# Patient Record
Sex: Female | Born: 1985
Health system: Southern US, Community
[De-identification: ages and names within clinical notes are randomized; demographics above are authoritative.]

---

## 2019-10-30 IMAGING — CT CT ABD-PELV W/ CM
2 of 4 series · 16 of 46 positions shown, 18 images · IV contrast (Omni 300)
Comparison: None.

CLINICAL DATA: Right-sided upper abdominal and flank pain since
[REDACTED].

EXAM:
CT ABDOMEN AND PELVIS WITH CONTRAST
TECHNIQUE: Multidetector CT imaging of the abdomen and pelvis was performed
using the standard protocol following bolus administration of
intravenous contrast.
CONTRAST:  100mL OMNIPAQUE IOHEXOL 300 MG/ML  SOLN

[Series 3: a/p w/ 5mm · axial · 0.98mm/px · z∈[-497,-77]mm · 13 of 92 slices shown, 15 images]
[im 4/92  soft-tissue]
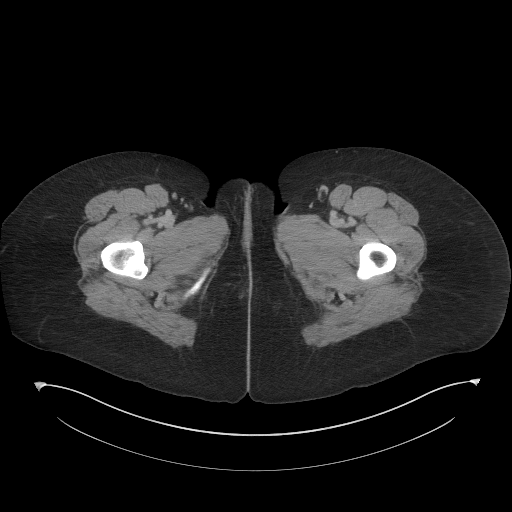
[im 4/92  bone]
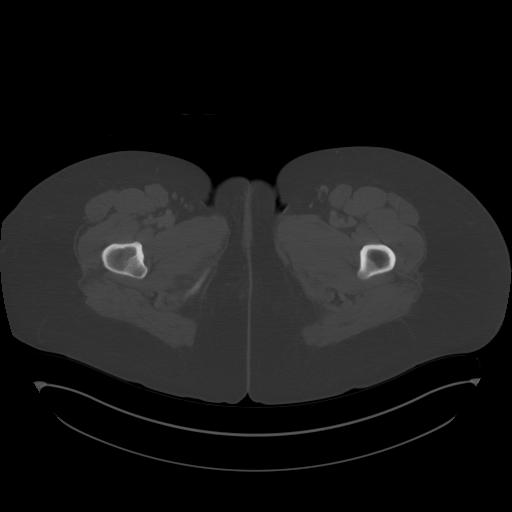
[im 11/92  soft-tissue]
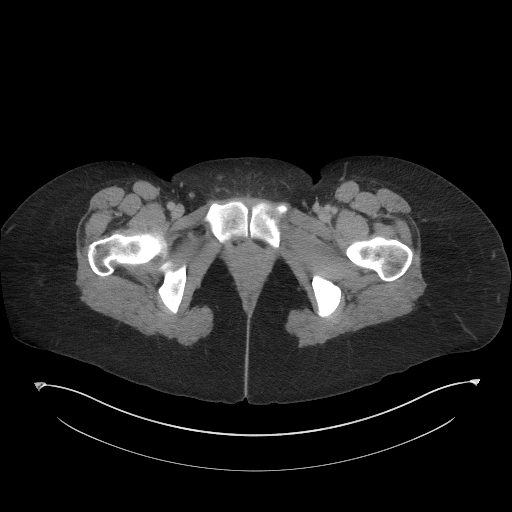
[im 19/92  soft-tissue]
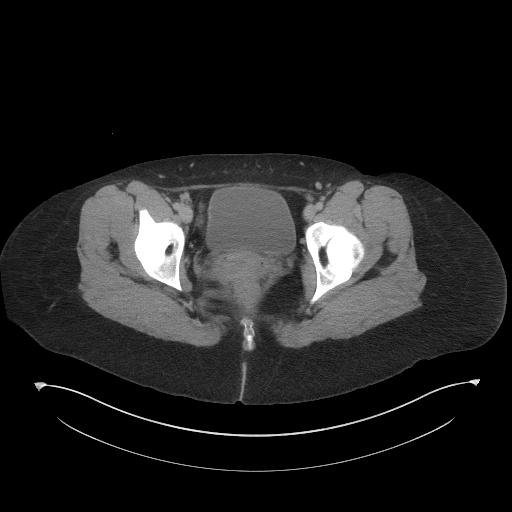
[im 26/92  soft-tissue]
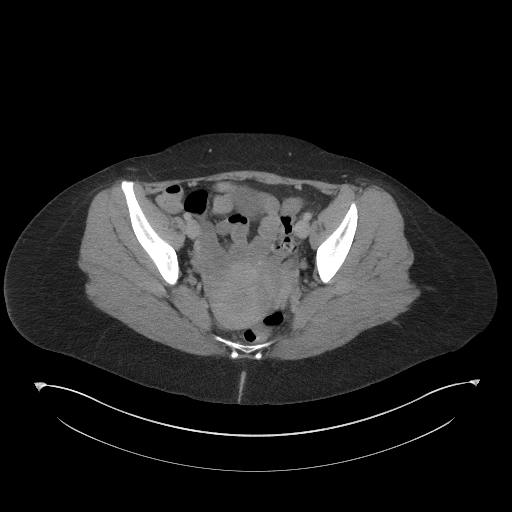
[im 33/92  soft-tissue]
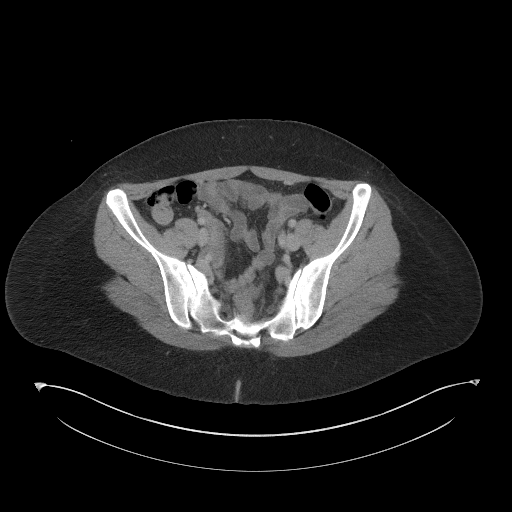
[im 41/92  soft-tissue]
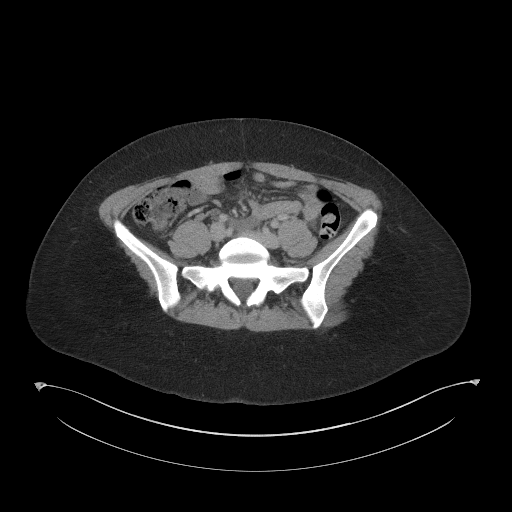
[im 48/92  soft-tissue]
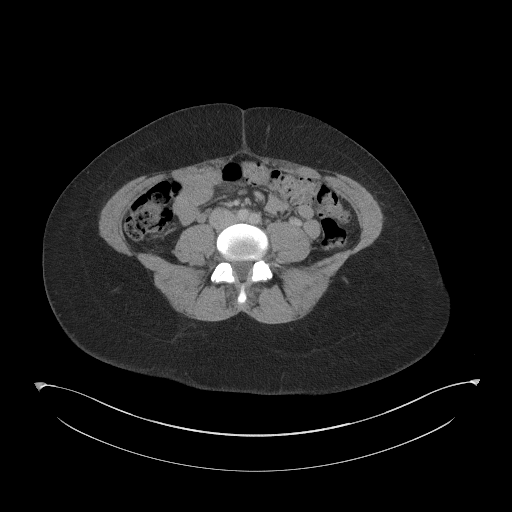
[im 51/92  soft-tissue]
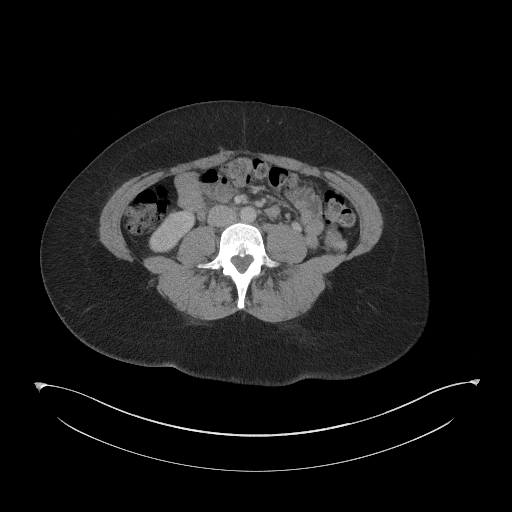
[im 59/92  soft-tissue]
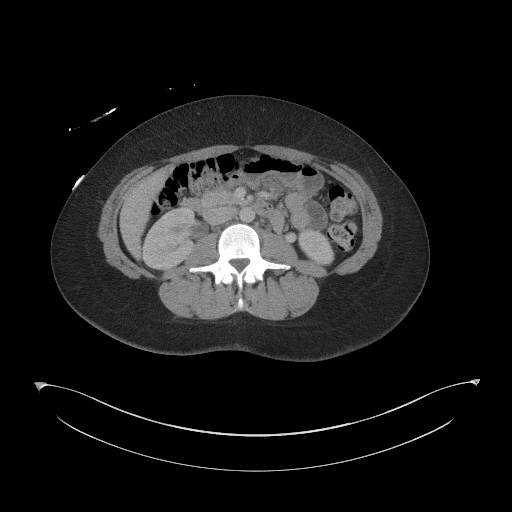
[im 59/92  bone]
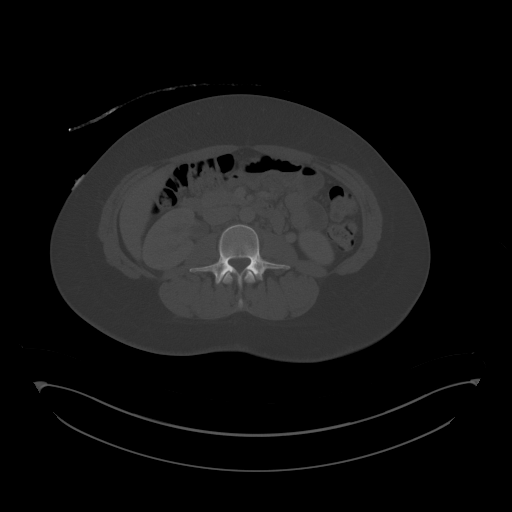
[im 66/92  soft-tissue]
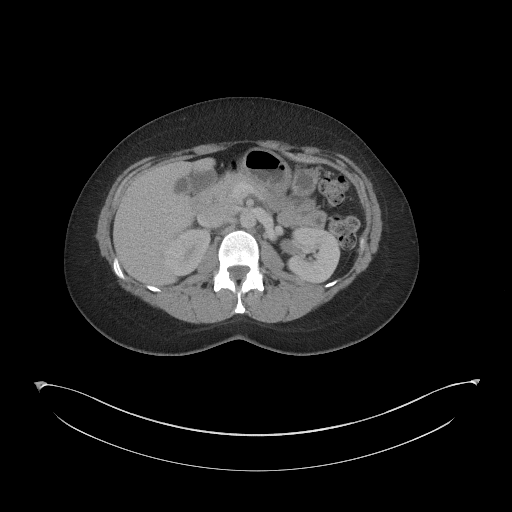
[im 73/92  soft-tissue]
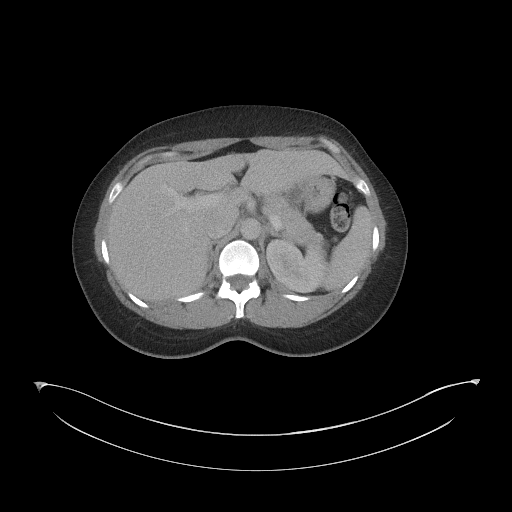
[im 81/92  soft-tissue]
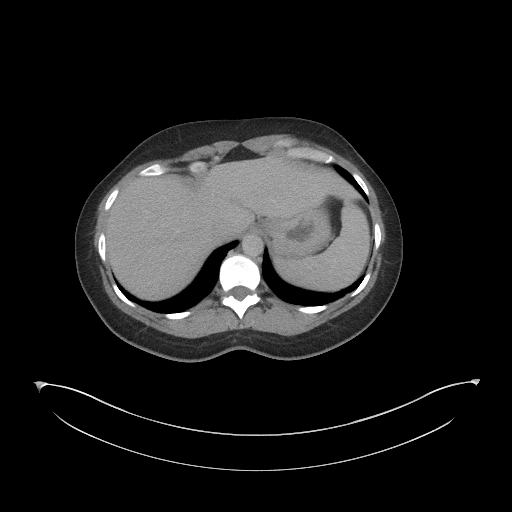
[im 88/92  soft-tissue]
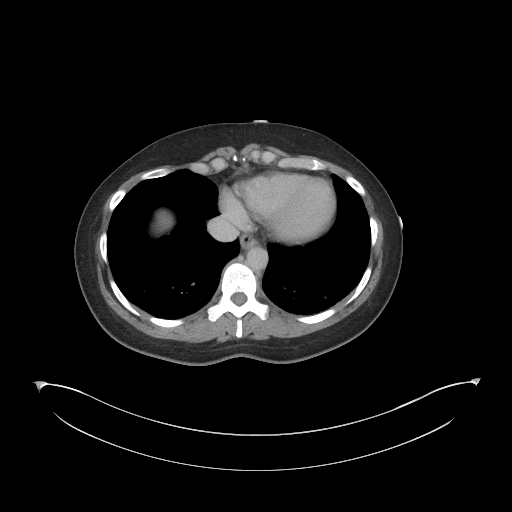

[Series 6: a/p w/ cor · coronal · 0.85mm/px · 3 of 152 slices shown]
[im 51/152  soft-tissue]
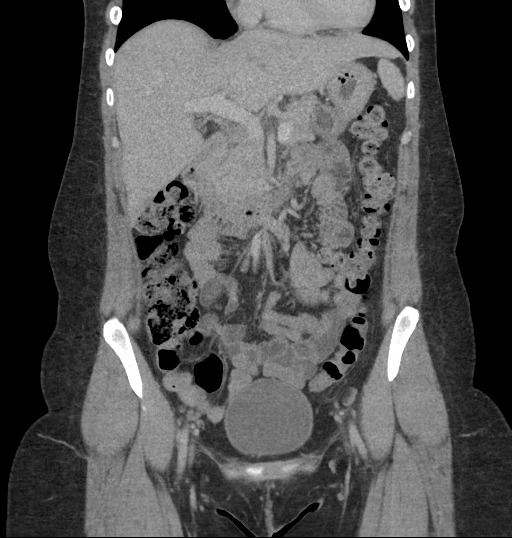
[im 68/152  soft-tissue]
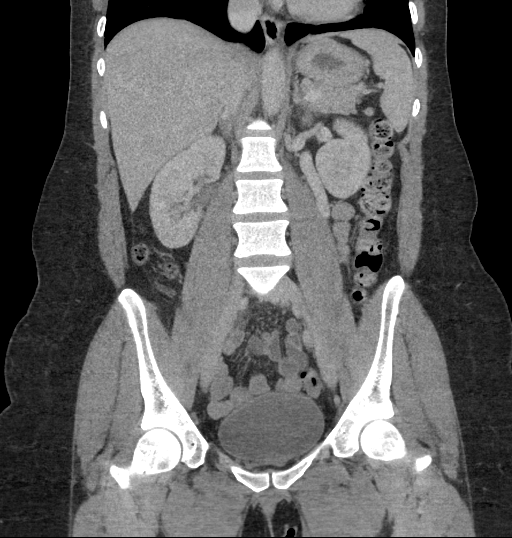
[im 84/152  soft-tissue]
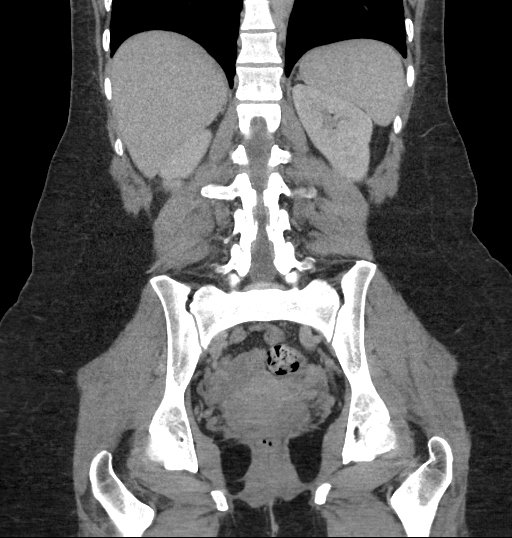

[16 of 46 positions shown; findings below may reference images not displayed]

FINDINGS: Lower chest: The lung bases are clear of acute process. No pleural
effusion or pulmonary lesions. The heart is normal in size. No
pericardial effusion. The distal esophagus and aorta are
unremarkable.

Hepatobiliary: No focal hepatic lesions or intrahepatic biliary
dilatation. The gallbladder is normal. No common bile duct
dilatation.

Pancreas: No mass, inflammation or ductal dilatation.

Spleen: Normal size.  No focal lesions.

Adrenals/Urinary Tract: The adrenal glands and kidneys are
unremarkable.

Mild right-sided hydroureteronephrosis but no obstructing ureteral
calculi are identified. No renal calculi. No perinephric
interstitial changes. Could not exclude a recently passed calculus.

The left kidney is normal. No renal or ureteral calculi. No renal
lesions.

The bladder is unremarkable. No asymmetric bladder wall thickening
or bladder mass.

Stomach/Bowel: The stomach, duodenum, small bowel and colon are
grossly normal without oral contrast. No inflammatory changes, mass
lesions or obstructive findings. The terminal ileum and appendix are
normal.

Vascular/Lymphatic: The aorta is normal in caliber. No dissection.
The branch vessels are patent. The major venous structures are
patent. No mesenteric or retroperitoneal mass or adenopathy. Small
scattered lymph nodes are noted.

Reproductive: The uterus is retroverted. The endometrium appears
normal in thickness. The ovaries are unremarkable.

Other: No pelvic mass or significant free pelvic fluid collections.
No inguinal mass or adenopathy.

Musculoskeletal: No significant bony findings.
IMPRESSION: 1. Mild right-sided hydroureteronephrosis but no obstructing
ureteral calculus or bladder calculus. Possibly recent passed stone.
2. No renal or bladder calculi or mass.
3. No acute abdominal/pelvic findings, mass lesions or adenopathy.

## 2021-01-20 IMAGING — MR MR ANKLE*R* W/O CM
4 of 6 series · 19 of 40 positions shown · non-contrast
Comparison: Multiple exams, including [DATE] radiograph

CLINICAL DATA: Pain in foot and ankle.

EXAM:
MRI OF THE RIGHT ANKLE WITHOUT CONTRAST
TECHNIQUE: Multiplanar, multisequence MR imaging of the ankle was performed. No
intravenous contrast was administered.

[Series 3: PD fat-sat · axial · right · 3.0mm · 0.31mm/px · z∈[-18,+116]mm · 7 of 35 slices shown]
[im 1/35]
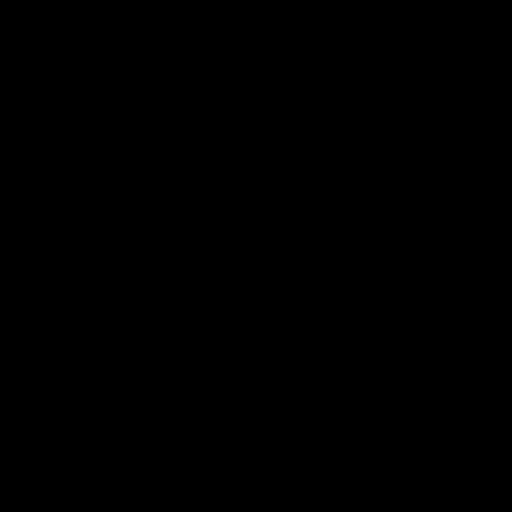
[im 6/35]
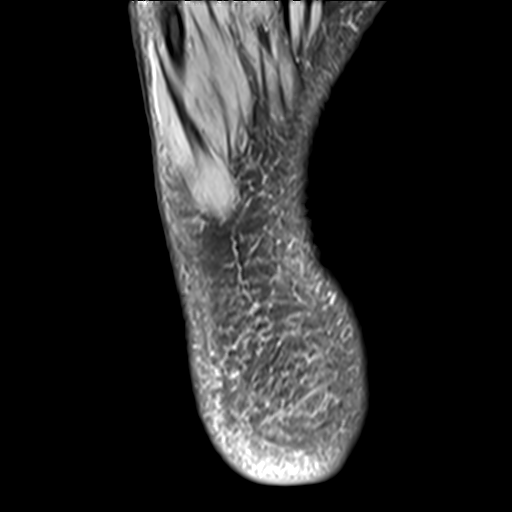
[im 12/35]
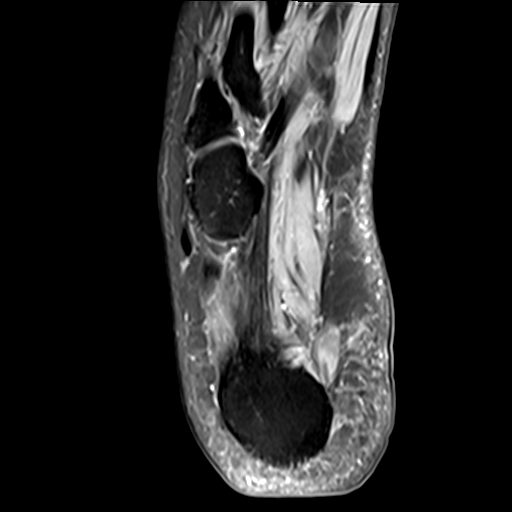
[im 18/35]
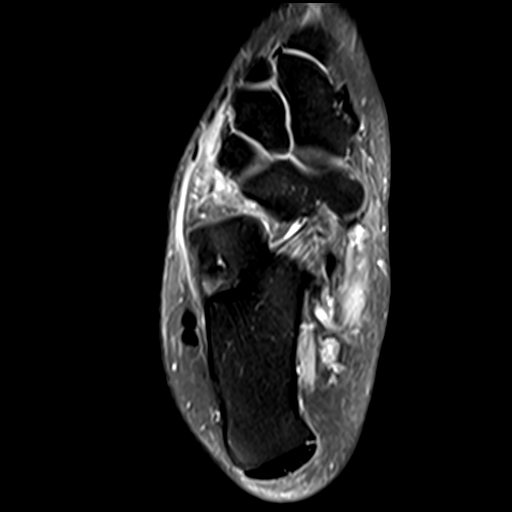
[im 23/35]
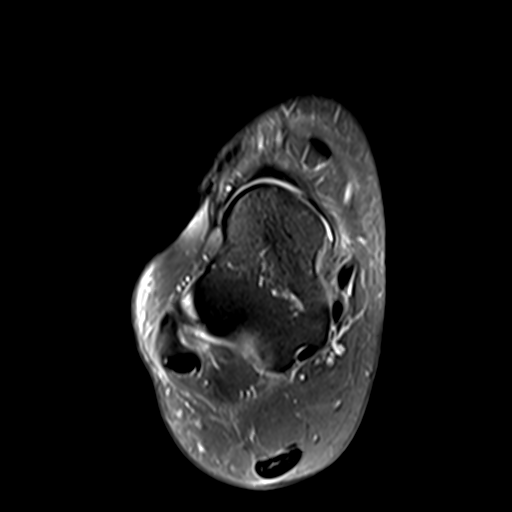
[im 29/35]
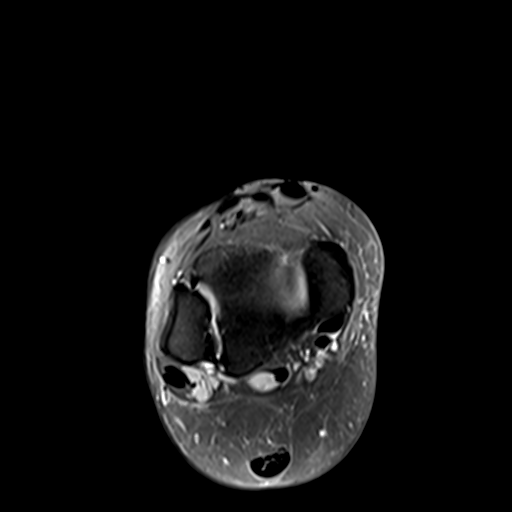
[im 35/35]
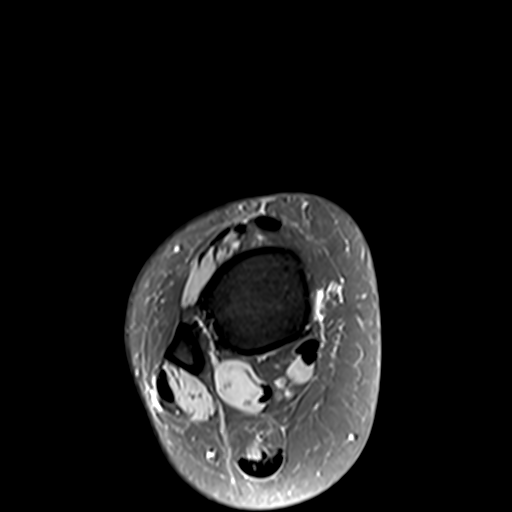

[Series 4: T2 fat-sat · axial · right · 3.0mm · 0.31mm/px · z∈[-18,+92]mm · 6 of 35 slices shown (1 of 2)]
[im 1/35]
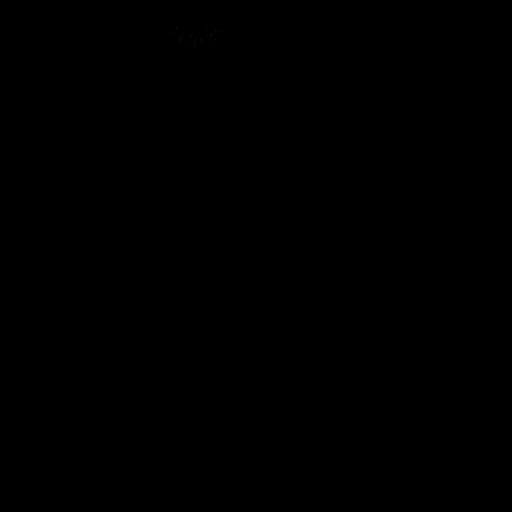
[im 6/35]
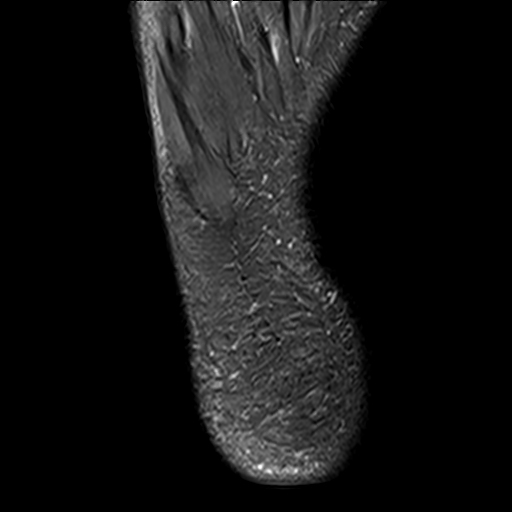
[im 12/35]
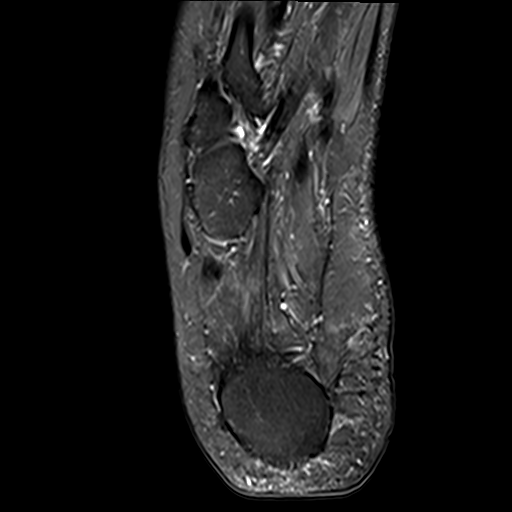
[im 18/35]
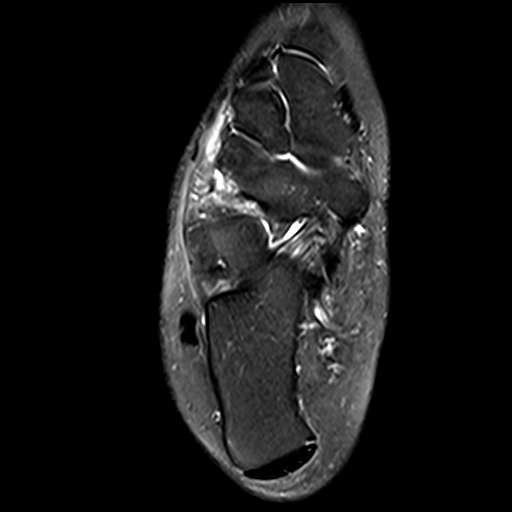
[im 23/35]
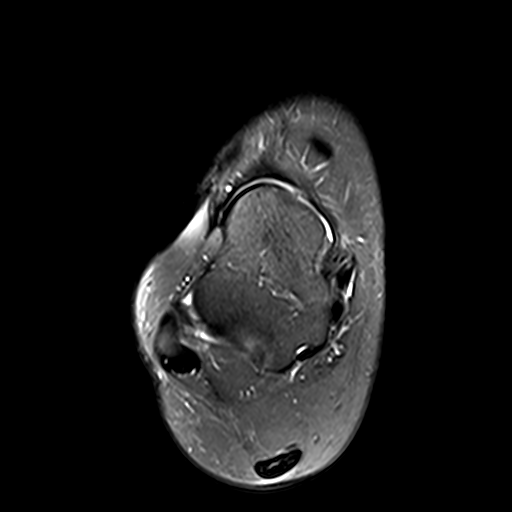
[im 29/35]
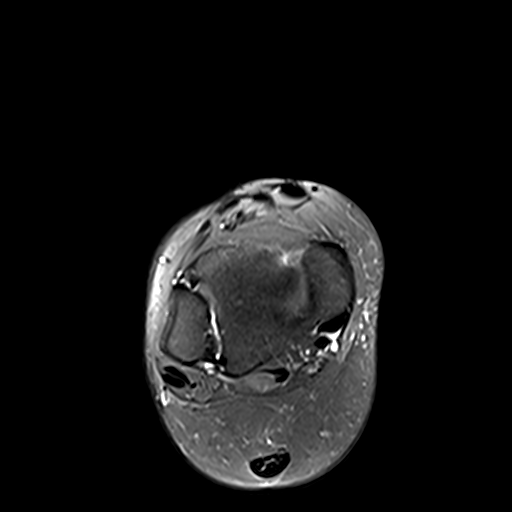

[Series 5: T1 · axial · right · 3.0mm · 0.31mm/px · z∈[+3,+91]mm · 3 of 35 slices shown]
[im 6/35]
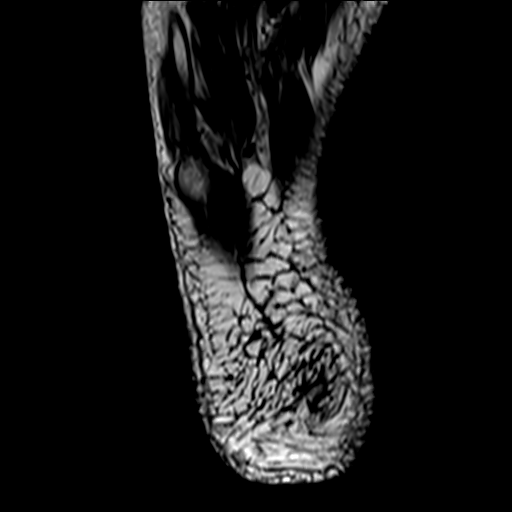
[im 18/35]
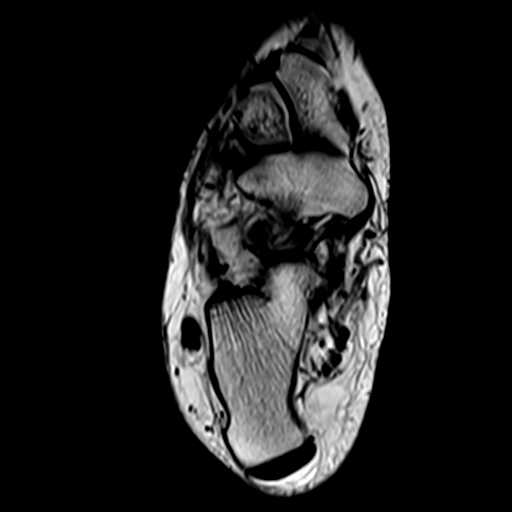
[im 29/35]
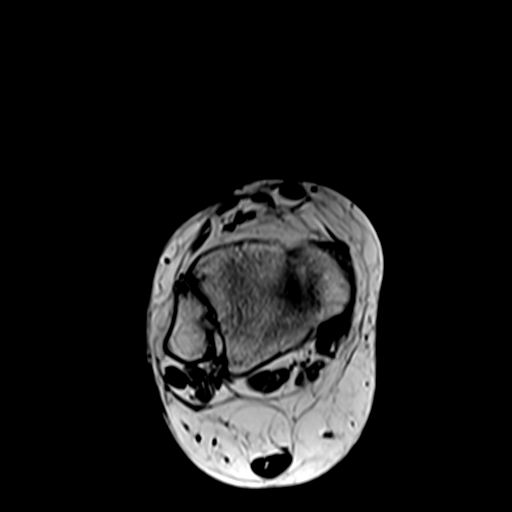

[Series 8: T2 fat-sat · coronal · right · 3.0mm · 0.31mm/px · 3 of 43 slices shown (2 of 2)]
[im 6/43]
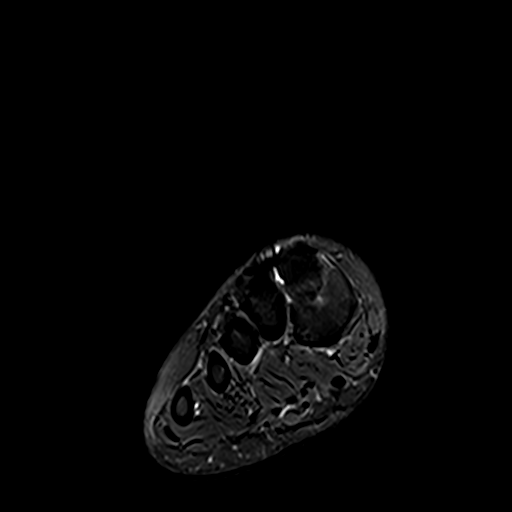
[im 22/43]
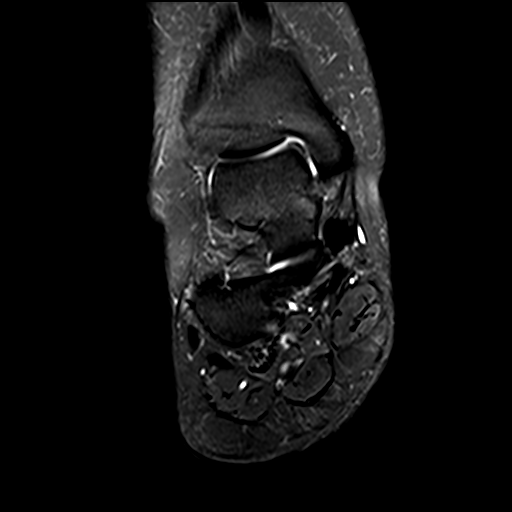
[im 37/43]
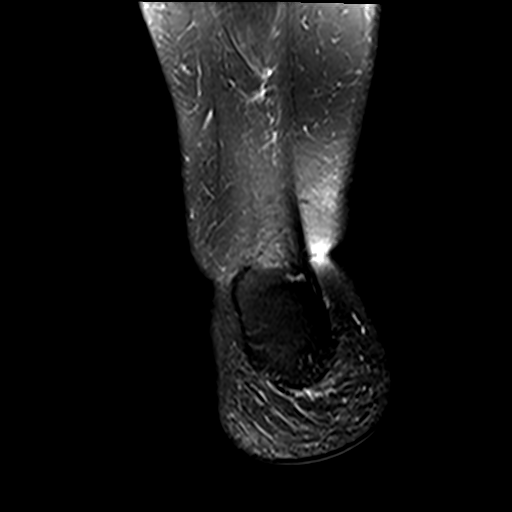

[19 of 40 positions shown; findings below may reference images not displayed]

FINDINGS: TENDONS

Peroneal: Unremarkable unremarkable

Posteromedial: Distal tibialis posterior tenosynovitis and
tendinopathy.

Anterior: Unremarkable

Achilles: Unremarkable

Plantar Fascia: Unremarkable

LIGAMENTS

Lateral: Unremarkable

Medial: Unremarkable

CARTILAGE

Ankle Joint: Unremarkable

Subtalar Joints/Sinus Tarsi: Unremarkable

Bones: No significant extra-articular osseous abnormalities
identified.

Other: No supplemental non-categorized findings.
IMPRESSION: 1. Distal tibialis posterior tenosynovitis and tendinopathy.
Correlate clinically in assessing for tibialis posterior
dysfunction.

## 2021-11-27 IMAGING — CR DG CHEST 1V
1 series · 1 of 1 positions shown · non-contrast
Comparison: [DATE].

CLINICAL DATA: Chest pain.

EXAM:
CHEST  1 VIEW

[chest pa]
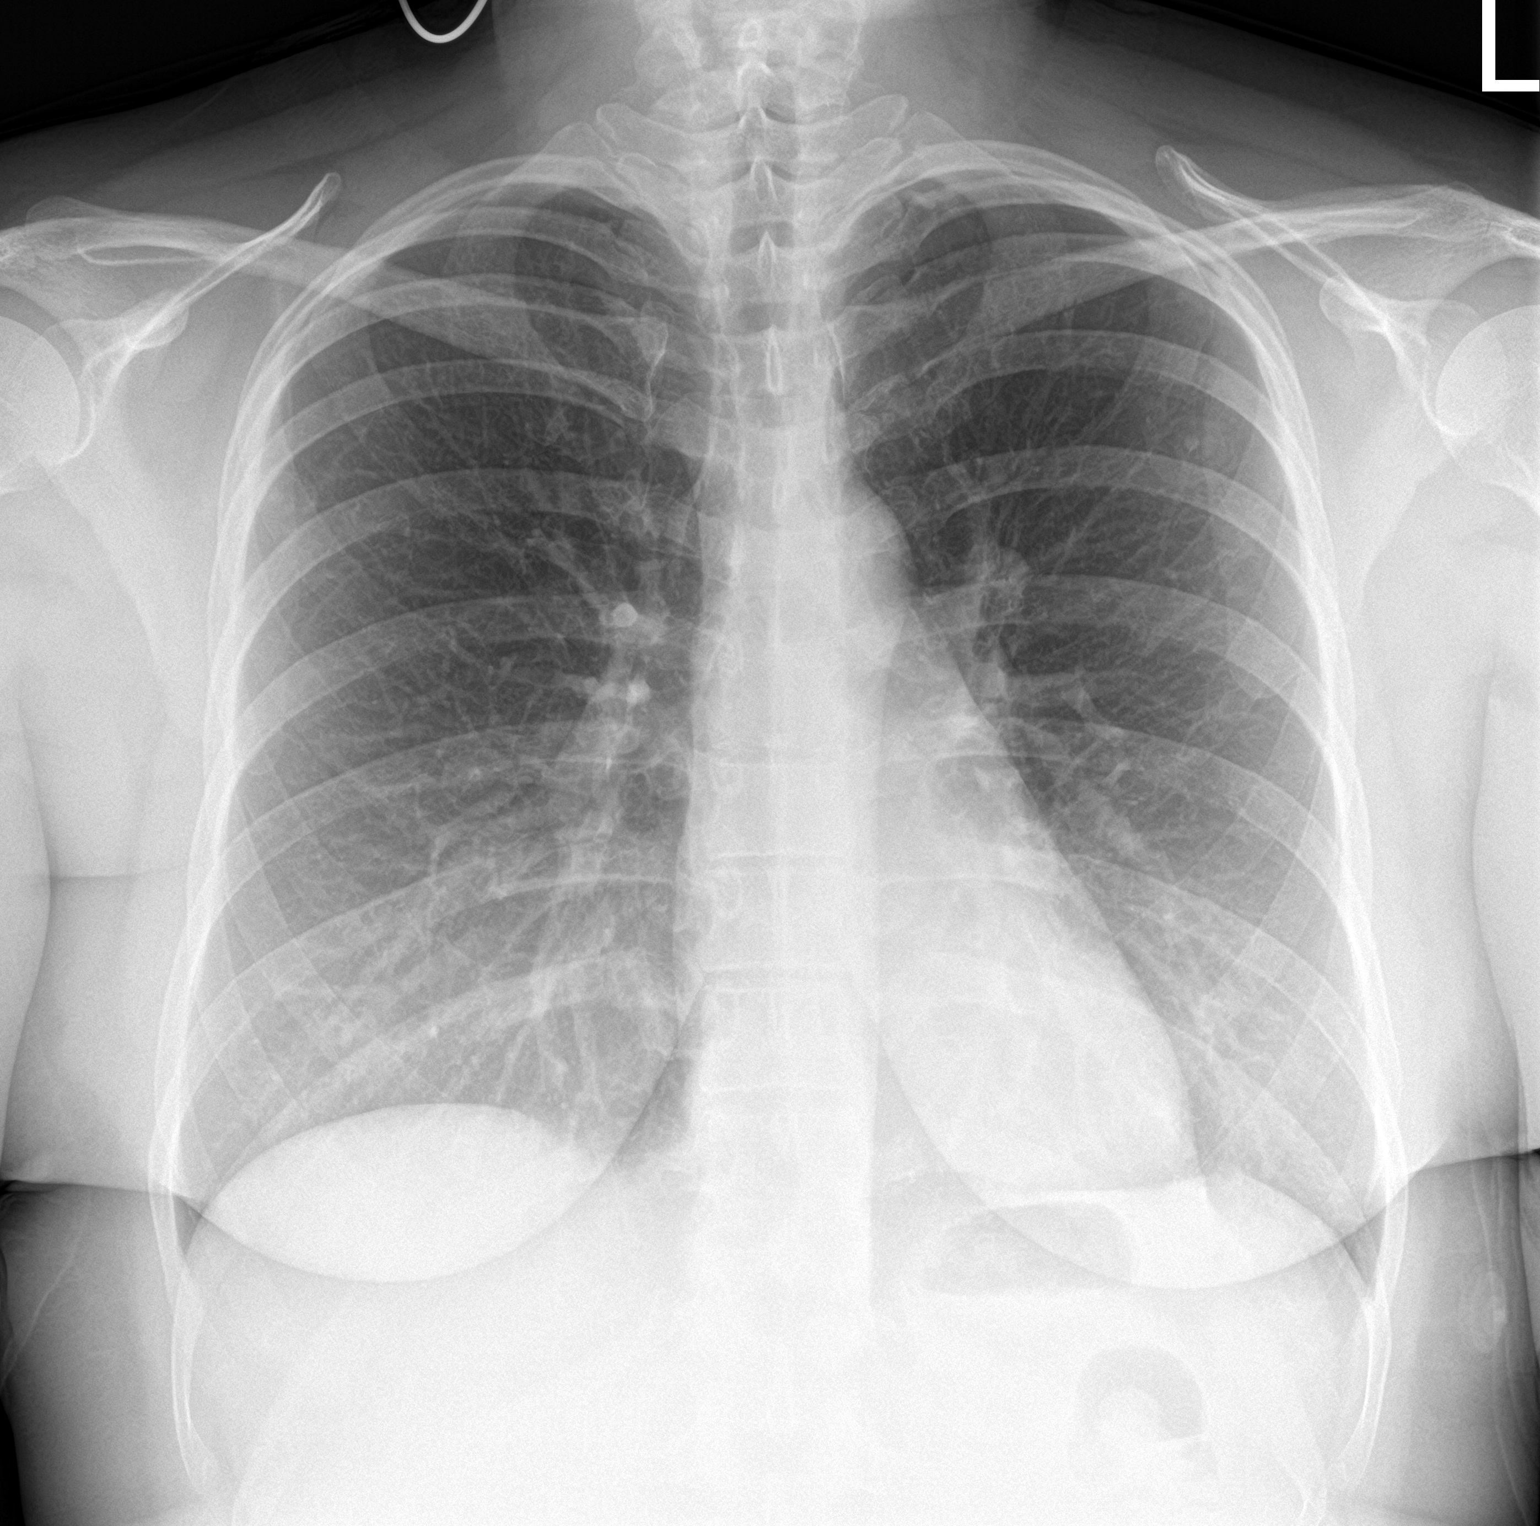

[1 of 1 positions shown; findings below may reference images not displayed]

FINDINGS: The heart size and mediastinal contours are within normal limits.
Both lungs are clear. No acute osseous abnormality.
IMPRESSION: No acute cardiopulmonary process.

## 2021-11-29 IMAGING — US US ABDOMEN COMPLETE
1 series · 13 of 25 positions shown · non-contrast
Comparison: CT 10/24/2020
COMPARISON: CT [DATE]

CLINICAL DATA: Upper abdominal pain.  Back pain.

EXAM:
ABDOMEN ULTRASOUND COMPLETE

[Series 1: us abdomen complete · 13 of 88 slices shown]
[im 1/88]
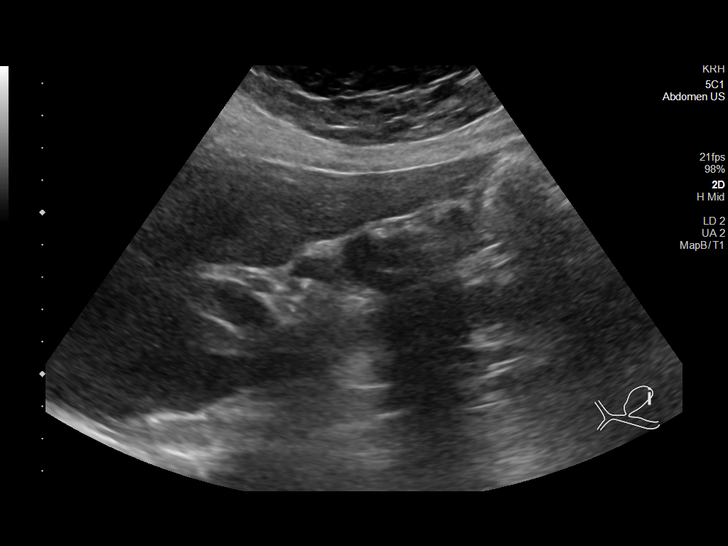
[im 8/88]
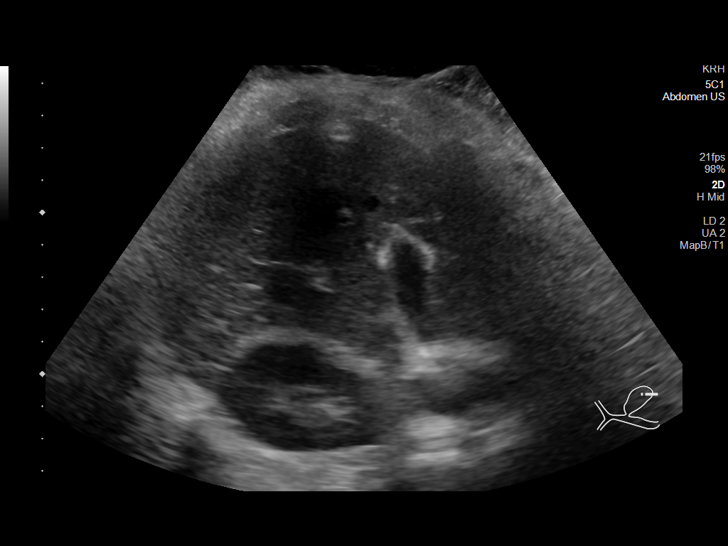
[im 15/88]
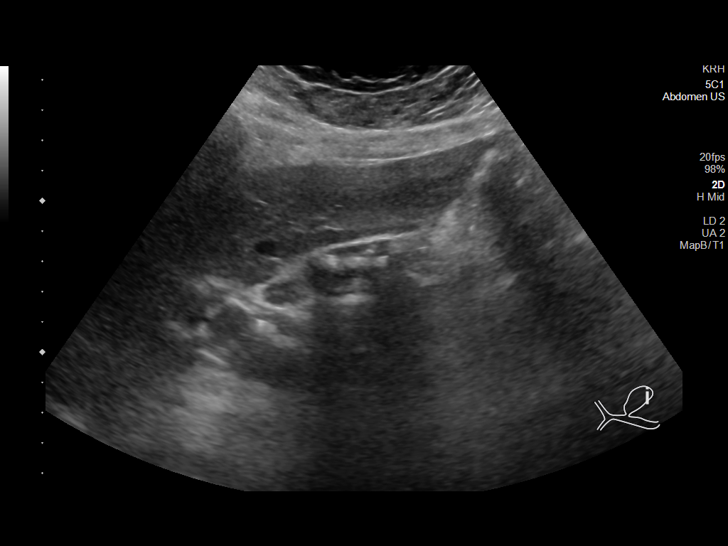
[im 22/88]
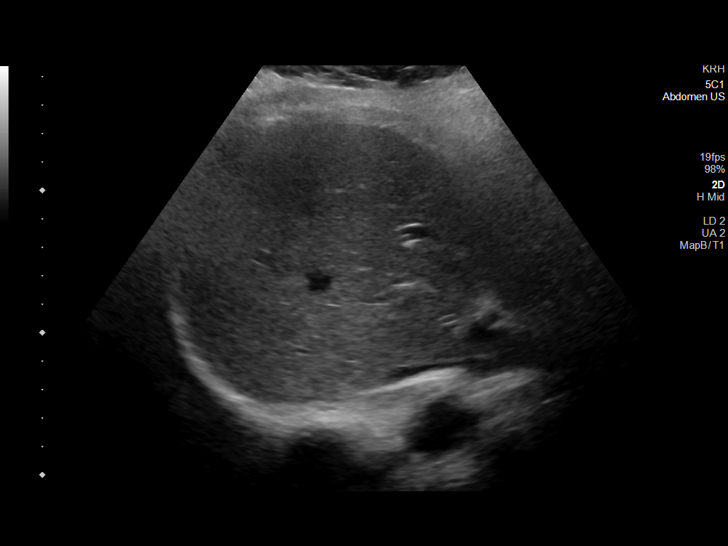
[im 30/88]
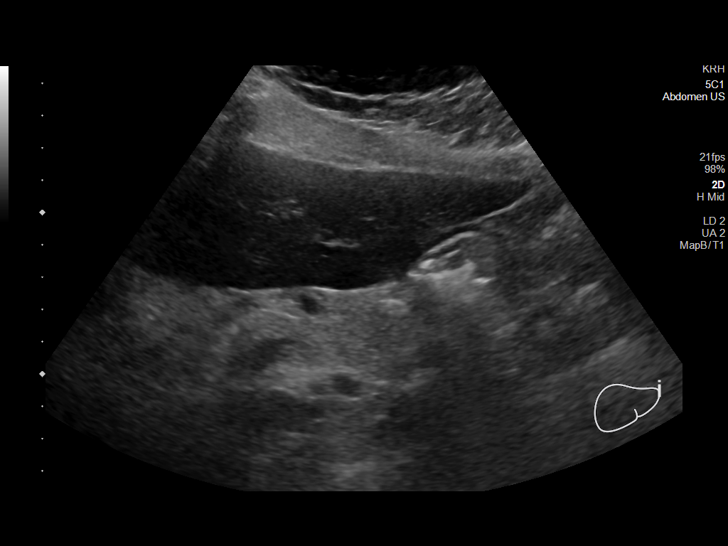
[im 37/88]
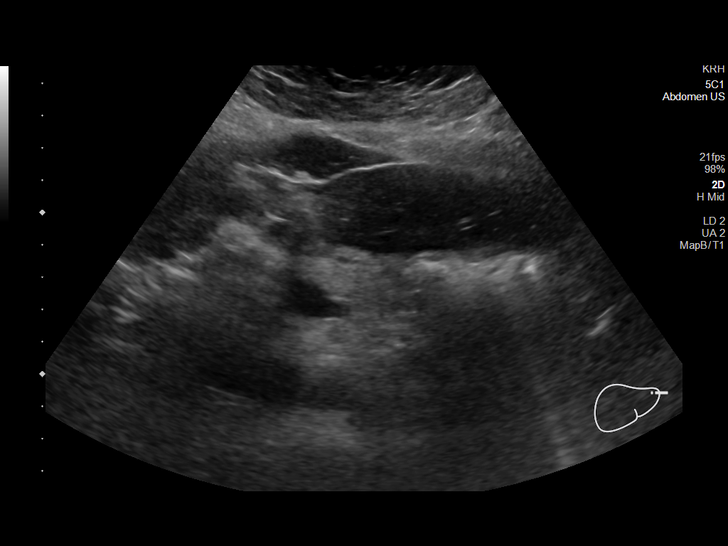
[im 44/88]
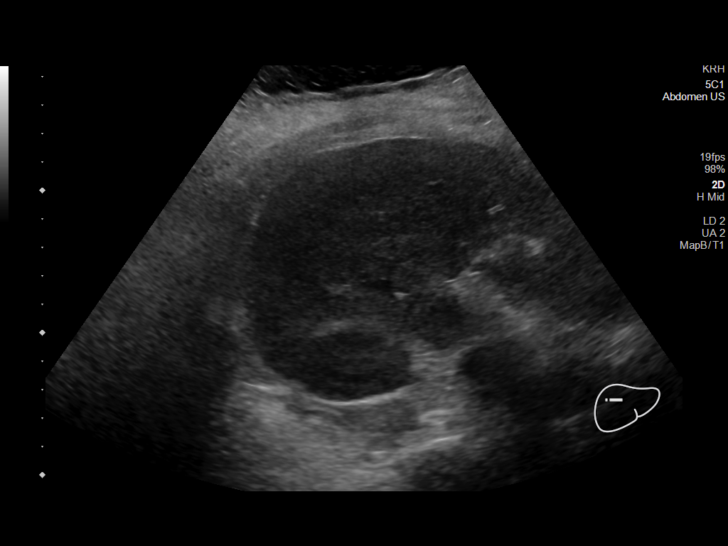
[im 51/88]
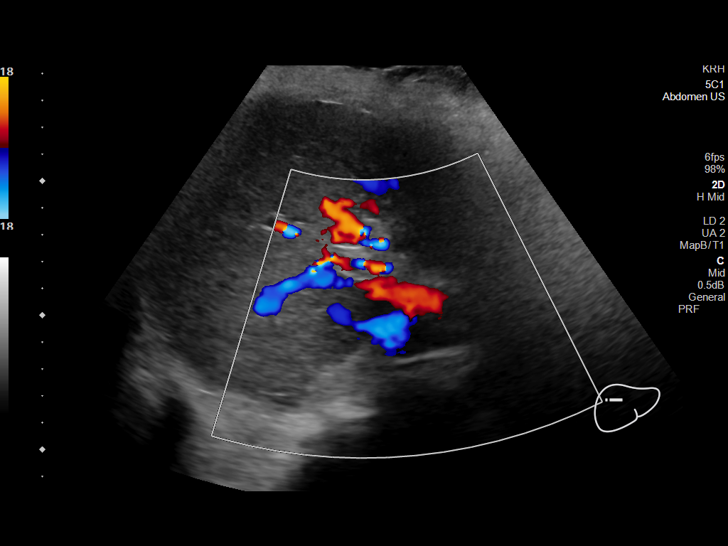
[im 59/88]
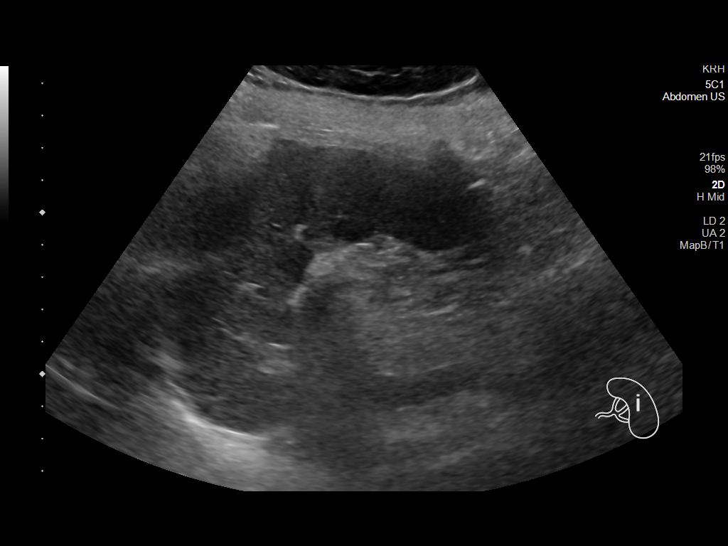
[im 66/88]
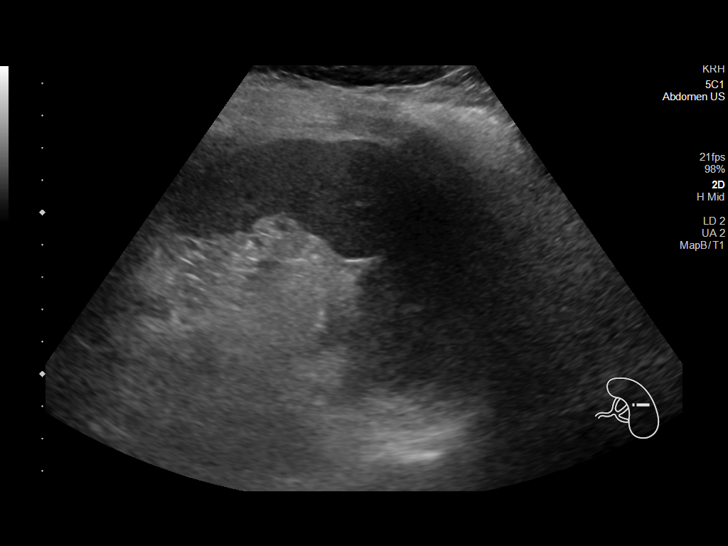
[im 73/88]
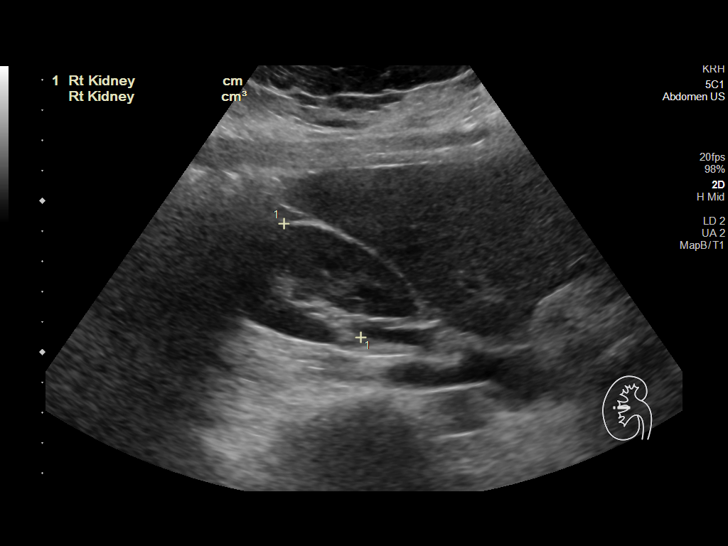
[im 80/88]
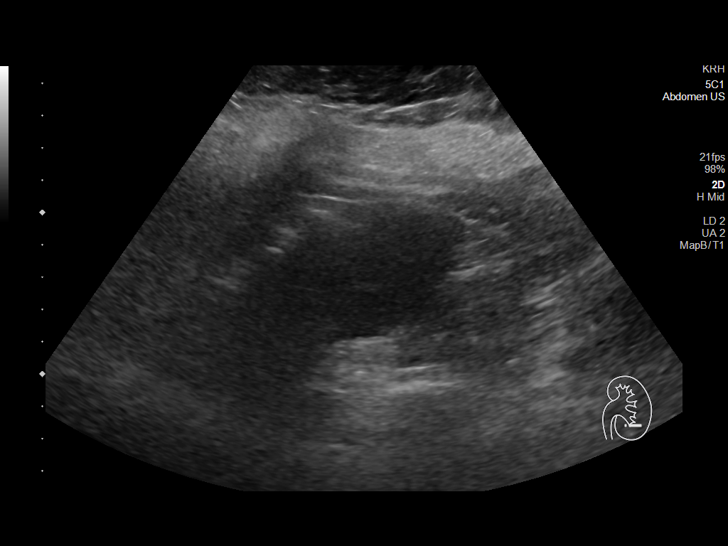
[im 88/88]
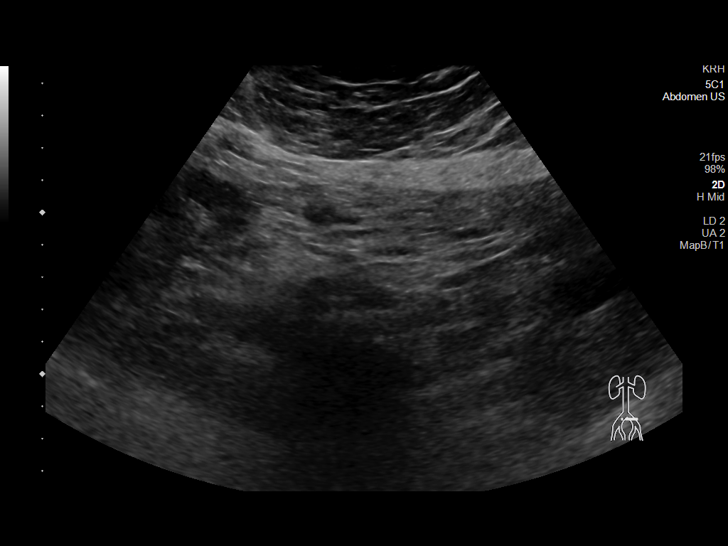

[13 of 25 positions shown; findings below may reference images not displayed]

FINDINGS: Gallbladder: Intraluminal sludge and multiple gallstones, shadowing
partially obscures the posterior wall. Borderline gallbladder wall
thickness of 3 mm. No pericholecystic fluid. Positive sonographic
Murphy sign noted by sonographer.

Common bile duct: Diameter: 2 mm, normal. Distal common bile duct is
obscured.

Liver: No focal lesion identified. Within normal limits in
parenchymal echogenicity. Portal vein is patent on color Doppler
imaging with normal direction of blood flow towards the liver.

IVC: No abnormality visualized.

Pancreas: Visualized portion unremarkable.

Spleen: Size and appearance within normal limits.

Right Kidney: Length: 10.5 cm. Normal parenchymal echogenicity. No
hydronephrosis. No visualized stone or focal lesion.

Left Kidney: Length: 10.8 cm. Normal parenchymal echogenicity. No
hydronephrosis. No visualized stone or focal lesion.

Abdominal aorta: No aneurysm visualized.

Other findings: No ascites.
IMPRESSION: 1. Gallstones and sludge with borderline wall thickening. Positive
sonographic Murphy sign reported by technologist. Sonographic
findings can be seen in the setting of acute cholecystitis in the
appropriate clinical setting.
2. No biliary dilatation.
3. Otherwise unremarkable abdominal ultrasound.
FINDINGS: Gallbladder: Intraluminal sludge and multiple gallstones, shadowing
partially obscures the posterior wall. Borderline gallbladder wall
thickness of 3 mm. No pericholecystic fluid. Positive sonographic
Murphy sign noted by sonographer.

Common bile duct: Diameter: 2 mm, normal. Distal common bile duct is
obscured.

Liver: No focal lesion identified. Within normal limits in
parenchymal echogenicity. Portal vein is patent on color Doppler
imaging with normal direction of blood flow towards the liver.

IVC: No abnormality visualized.

Pancreas: Visualized portion unremarkable.

Spleen: Size and appearance within normal limits.

Right Kidney: Length: 10.5 cm. Normal parenchymal echogenicity. No
hydronephrosis. No visualized stone or focal lesion.

Left Kidney: Length: 10.8 cm. Normal parenchymal echogenicity. No
hydronephrosis. No visualized stone or focal lesion.

Abdominal aorta: No aneurysm visualized.

Other findings: No ascites.
IMPRESSION: 1. Gallstones and sludge with borderline wall thickening. Positive
sonographic Murphy sign reported by technologist. Sonographic
findings can be seen in the setting of acute cholecystitis in the
appropriate clinical setting.
2. No biliary dilatation.
3. Otherwise unremarkable abdominal ultrasound.

## 2021-12-06 IMAGING — DX DG CHEST 1V PORT
1 series · 1 of 1 positions shown · non-contrast
Comparison: Portable exam [0W] hours without priors for comparison
COMPARISON: Portable exam 2828 hours without priors for comparison

CLINICAL DATA: Cough, RIGHT-side chest pain

EXAM:
PORTABLE CHEST 1 VIEW

[chest]
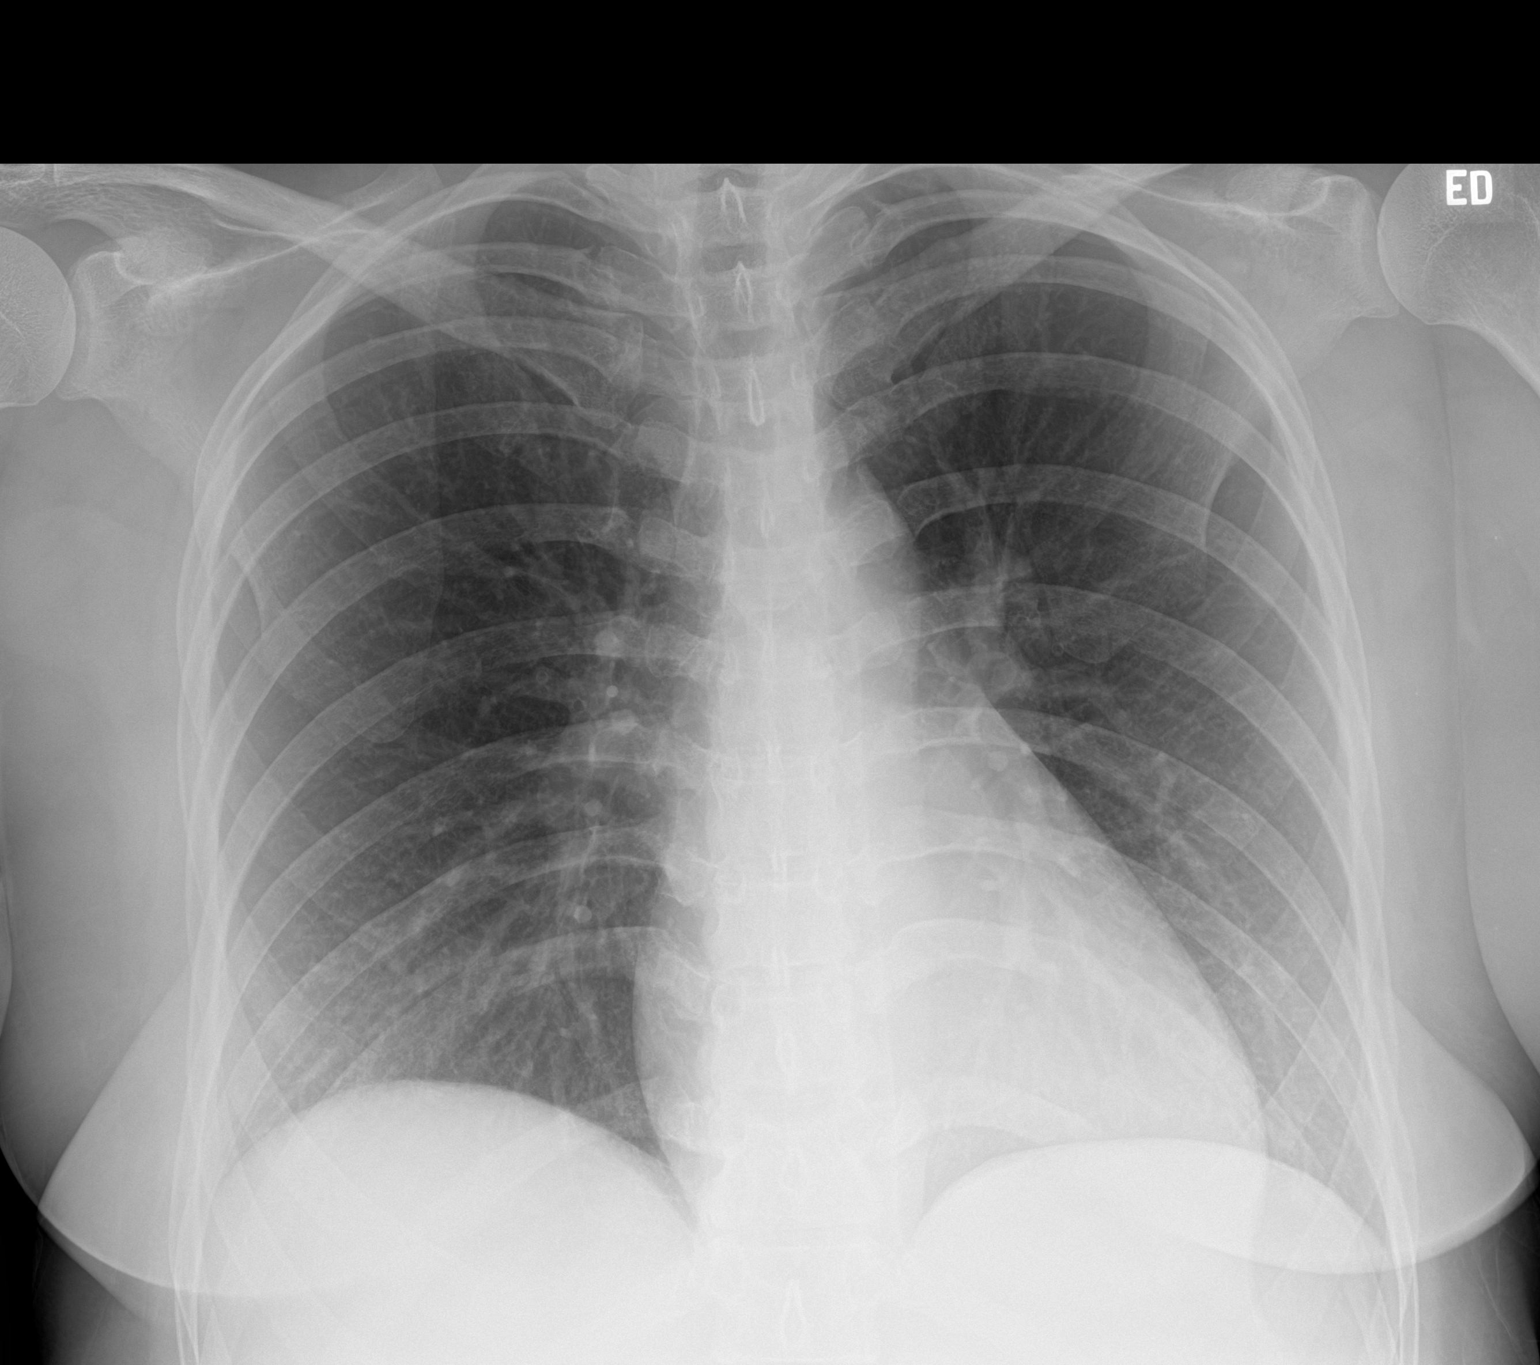

[1 of 1 positions shown; findings below may reference images not displayed]

FINDINGS: Normal heart size, mediastinal contours, and pulmonary vascularity.

Lungs clear.

No infiltrate, pleural effusion, or pneumothorax.

Bones unremarkable.
IMPRESSION: No acute abnormalities.  At
FINDINGS: Normal heart size, mediastinal contours, and pulmonary vascularity.

Lungs clear.

No infiltrate, pleural effusion, or pneumothorax.

Bones unremarkable.
IMPRESSION: No acute abnormalities.  At

## 2023-02-27 IMAGING — MR MR FOOT*R* W/O CM
5 series · 40 of 40 positions shown · non-contrast
Comparison: Radiographs [DATE]
COMPARISON: Radiographs 03/12/2021

CLINICAL DATA: Chronic foot pain.  Prior bunion surgery.

EXAM:
MRI OF THE RIGHT FOREFOOT WITHOUT CONTRAST
TECHNIQUE: Multiplanar, multisequence MR imaging of the right forefoot was
performed. No intravenous contrast was administered.

[Series 3: T1 · coronal · right · 3.0mm · 0.38mm/px · 10 of 43 slices shown (1 of 2)]
[im 1/43]
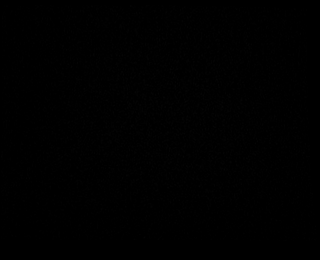
[im 5/43]
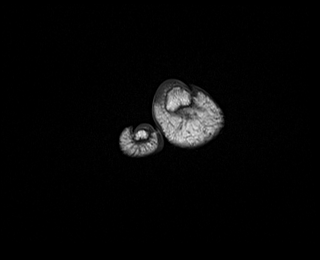
[im 10/43]
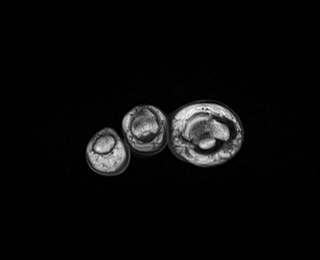
[im 15/43]
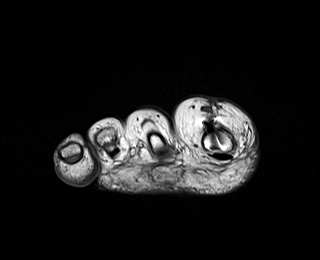
[im 19/43]
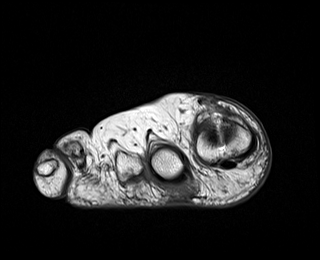
[im 24/43]
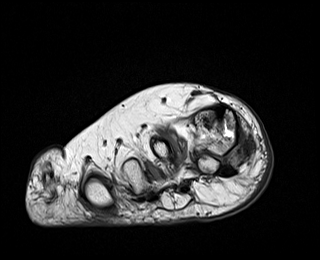
[im 29/43]
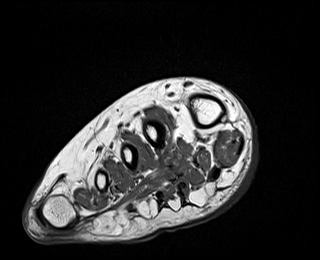
[im 33/43]
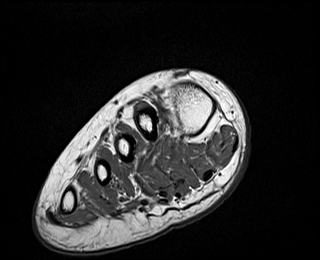
[im 38/43]
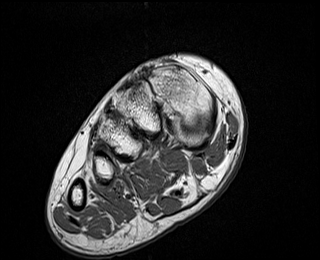
[im 43/43]
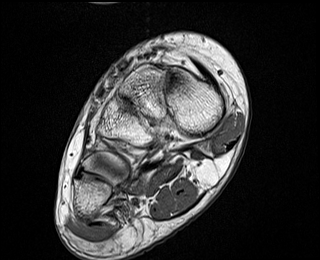

[Series 4: T2 fat-sat · coronal · right · 3.0mm · 0.38mm/px · 11 of 43 slices shown (1 of 2)]
[im 1/43]
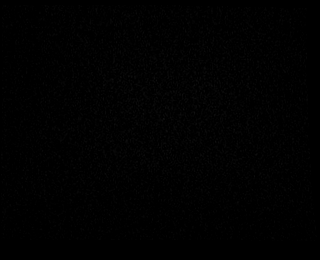
[im 5/43]
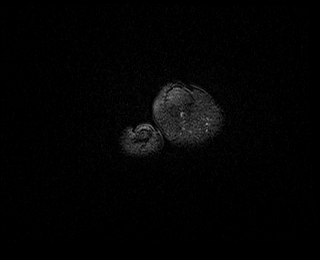
[im 9/43]
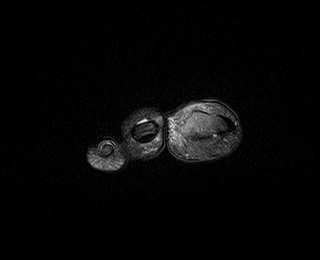
[im 13/43]
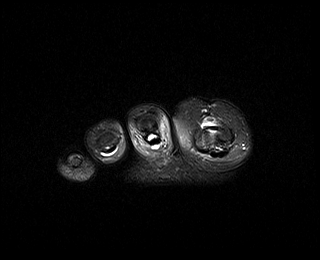
[im 17/43]
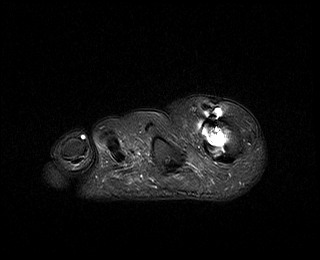
[im 22/43]
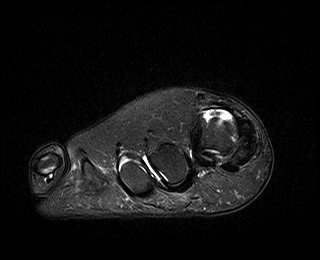
[im 26/43]
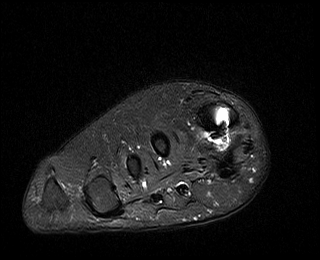
[im 30/43]
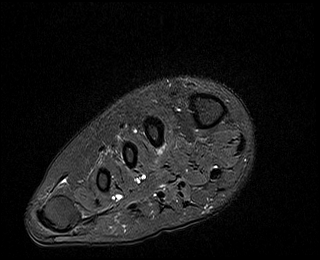
[im 34/43]
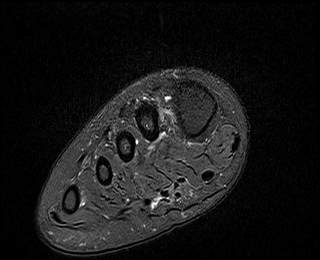
[im 38/43]
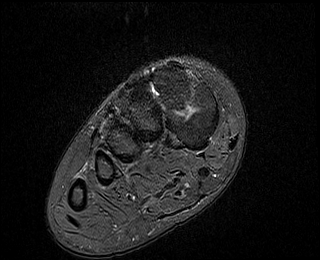
[im 43/43]
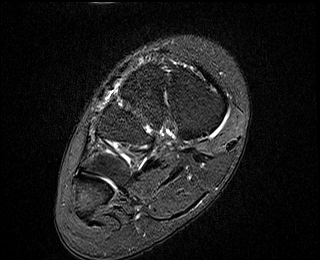

[Series 5: STIR · sagittal · right · 3.0mm · 0.70mm/px · 7 of 30 slices shown]
[im 1/30]
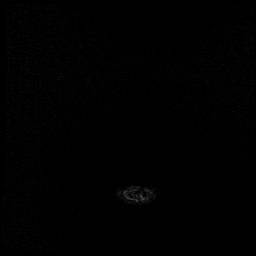
[im 5/30]
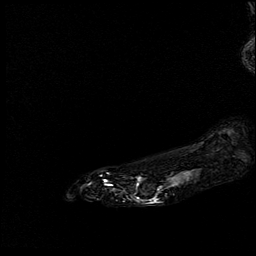
[im 10/30]
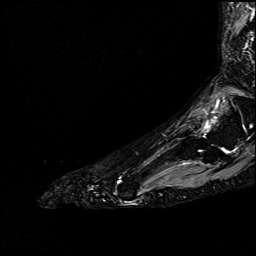
[im 15/30]
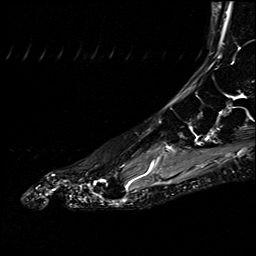
[im 20/30]
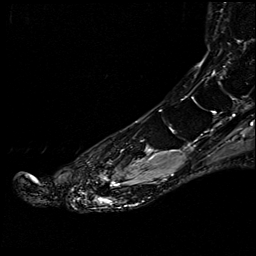
[im 25/30]
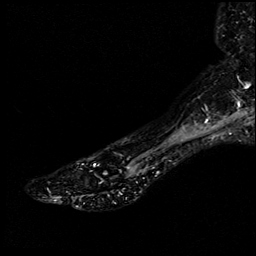
[im 30/30]
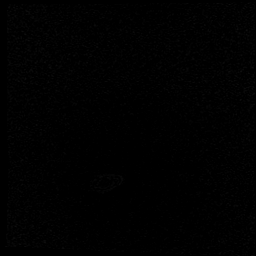

[Series 6: T1 · axial · right · 3.0mm · 0.56mm/px · z∈[-134,-53]mm · 6 of 23 slices shown (2 of 2)]
[im 1/23]
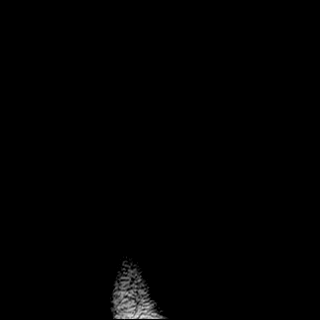
[im 5/23]
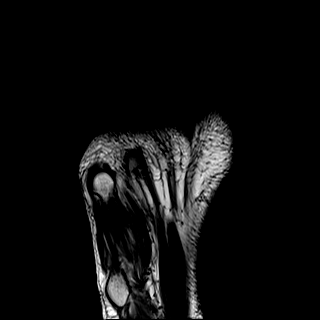
[im 9/23]
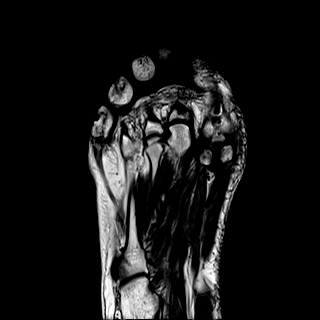
[im 14/23]
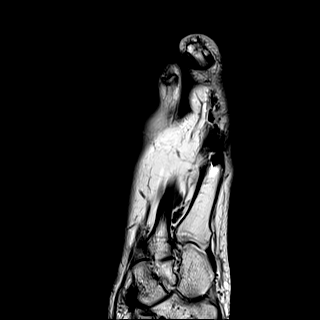
[im 18/23]
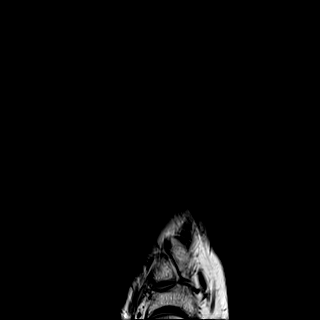
[im 23/23]
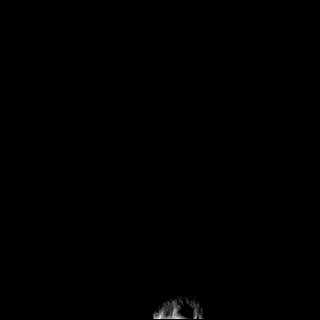

[Series 7: T2 fat-sat · axial · right · 3.0mm · 0.56mm/px · z∈[-134,-53]mm · 6 of 23 slices shown (2 of 2)]
[im 1/23]
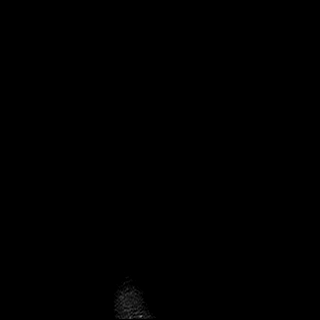
[im 5/23]
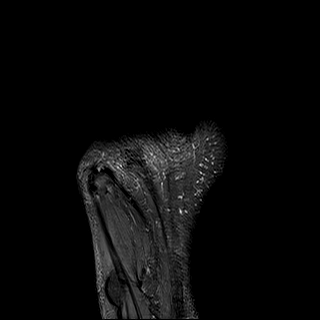
[im 9/23]
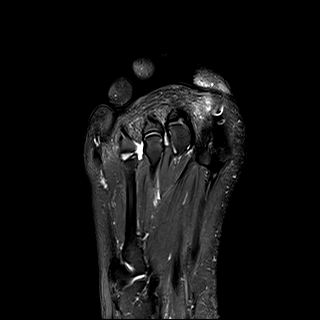
[im 14/23]
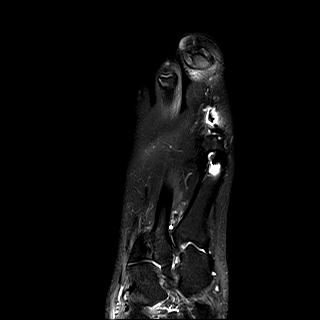
[im 18/23]
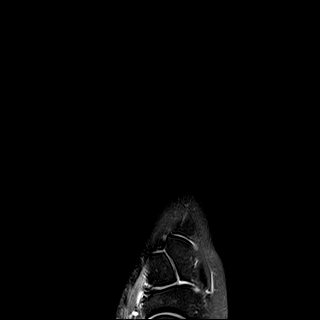
[im 23/23]
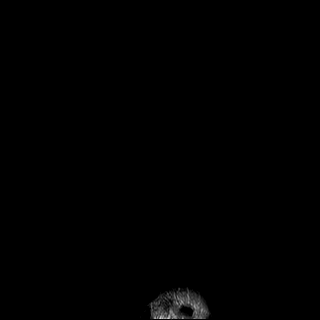

[40 of 40 positions shown; findings below may reference images not displayed]

FINDINGS: Bones/Joint/Cartilage

Metal artifact from the staple in the first digit proximal phalanx
and screw in the distal first metatarsal noted. Prior first
metatarsal osteotomy and bunionectomy.

Small degenerative subcortical cyst or erosion along the
plantar-medial head of the first metatarsal on image 13 of series 6,
along with a small geode or degenerative subcortical cyst along the
first metatarsal head articulation with the lateral sesamoid. No
significant marrow edema in the region. Mild spurring is present at
the first MTP joint with suspected mild to moderate loss of
articular space. Small foci of degenerative subcortical marrow edema
along the first MTP joint.

No metatarsal stress fracture.

Ligaments

The Lisfranc ligament appears intact. No compelling findings of
plantar plate injury.

Muscles and Tendons

Unremarkable

Soft tissues

Low-grade edema in the subcutaneous tissues just plantar to the
second MTP joint for example on image 18 series 5. No findings of
Morton's neuroma.
IMPRESSION: 1. Prior first metatarsal osteotomy and bunionectomy.
2. Mild to moderate degenerative findings at the first MTP joint.
3. Low-grade edema in the subcutaneous tissues just plantar to the
second MTP joint.
FINDINGS: Bones/Joint/Cartilage

Metal artifact from the staple in the first digit proximal phalanx
and screw in the distal first metatarsal noted. Prior first
metatarsal osteotomy and bunionectomy.

Small degenerative subcortical cyst or erosion along the
plantar-medial head of the first metatarsal on image 13 of series 6,
along with a small geode or degenerative subcortical cyst along the
first metatarsal head articulation with the lateral sesamoid. No
significant marrow edema in the region. Mild spurring is present at
the first MTP joint with suspected mild to moderate loss of
articular space. Small foci of degenerative subcortical marrow edema
along the first MTP joint.

No metatarsal stress fracture.

Ligaments

The Lisfranc ligament appears intact. No compelling findings of
plantar plate injury.

Muscles and Tendons

Unremarkable

Soft tissues

Low-grade edema in the subcutaneous tissues just plantar to the
second MTP joint for example on image 18 series 5. No findings of
Morton's neuroma.
IMPRESSION: 1. Prior first metatarsal osteotomy and bunionectomy.
2. Mild to moderate degenerative findings at the first MTP joint.
3. Low-grade edema in the subcutaneous tissues just plantar to the
second MTP joint.
